# Patient Record
Sex: Female | Born: 1945 | Race: White | Hispanic: No | Marital: Single | Smoking: Never smoker
Health system: Southern US, Community
[De-identification: ages and names within clinical notes are randomized; demographics above are authoritative.]

## PROBLEM LIST (undated history)

## (undated) DIAGNOSIS — R42 Dizziness and giddiness: Secondary | ICD-10-CM

## (undated) DIAGNOSIS — I10 Essential (primary) hypertension: Secondary | ICD-10-CM

## (undated) HISTORY — PX: EYE SURGERY: SHX253

## (undated) HISTORY — PX: TUBAL LIGATION: SHX77

---

## 2004-04-26 ENCOUNTER — Inpatient Hospital Stay: Payer: Self-pay | Admitting: Anesthesiology

## 2005-04-20 ENCOUNTER — Other Ambulatory Visit: Payer: Self-pay

## 2005-04-20 ENCOUNTER — Emergency Department: Payer: Self-pay | Admitting: Emergency Medicine

## 2008-06-11 ENCOUNTER — Ambulatory Visit: Payer: Self-pay

## 2010-11-02 ENCOUNTER — Emergency Department: Payer: Self-pay | Admitting: *Deleted

## 2012-09-12 ENCOUNTER — Emergency Department: Payer: Self-pay | Admitting: Emergency Medicine

## 2012-09-12 LAB — TROPONIN I: Troponin-I: <0.02 ng/mL

## 2012-09-12 LAB — COMPREHENSIVE METABOLIC PANEL
Albumin: 4 g/dL (ref 3.4–5.0)
Alkaline Phosphatase: 159 U/L — ABNORMAL HIGH (ref 50–136)
Anion Gap: 6 — ABNORMAL LOW (ref 7–16)
BUN: 5 mg/dL — ABNORMAL LOW (ref 7–18)
Calcium, Total: 9.1 mg/dL (ref 8.5–10.1)
Co2: 26 mmol/L (ref 21–32)
Creatinine: 0.7 mg/dL (ref 0.60–1.30)
EGFR (African American): >60
EGFR (Non-African Amer.): >60
Glucose: 94 mg/dL (ref 65–99)
Potassium: 3.6 mmol/L (ref 3.5–5.1)
SGOT(AST): 26 U/L (ref 15–37)
Total Protein: 7.2 g/dL (ref 6.4–8.2)

## 2012-09-12 LAB — CBC WITH DIFFERENTIAL/PLATELET
Basophil #: 0.1 10*3/uL (ref 0.0–0.1)
Basophil %: 0.9 %
Eosinophil #: 0.3 10*3/uL (ref 0.0–0.7)
Eosinophil %: 3.7 %
HGB: 14.3 g/dL (ref 12.0–16.0)
MCH: 29.8 pg (ref 26.0–34.0)
Monocyte #: 0.5 x10 3/mm (ref 0.2–0.9)
Neutrophil #: 4.5 10*3/uL (ref 1.4–6.5)
Neutrophil %: 64.5 %

## 2018-06-08 ENCOUNTER — Encounter: Payer: Self-pay | Admitting: Emergency Medicine

## 2018-06-08 ENCOUNTER — Other Ambulatory Visit: Payer: Self-pay

## 2018-06-08 ENCOUNTER — Emergency Department: Payer: Medicare HMO

## 2018-06-08 ENCOUNTER — Emergency Department
Admission: EM | Admit: 2018-06-08 | Discharge: 2018-06-08 | Disposition: A | Discharge status: Home / Self Care | Payer: Medicare HMO | Attending: Emergency Medicine | Admitting: Emergency Medicine

## 2018-06-08 DIAGNOSIS — E049 Nontoxic goiter, unspecified: Secondary | ICD-10-CM | POA: Insufficient documentation

## 2018-06-08 DIAGNOSIS — Y9384 Activity, sleeping: Secondary | ICD-10-CM | POA: Diagnosis not present

## 2018-06-08 DIAGNOSIS — E860 Dehydration: Secondary | ICD-10-CM | POA: Diagnosis not present

## 2018-06-08 DIAGNOSIS — K449 Diaphragmatic hernia without obstruction or gangrene: Secondary | ICD-10-CM | POA: Diagnosis not present

## 2018-06-08 DIAGNOSIS — I10 Essential (primary) hypertension: Secondary | ICD-10-CM | POA: Diagnosis not present

## 2018-06-08 DIAGNOSIS — R000 Tachycardia, unspecified: Secondary | ICD-10-CM | POA: Insufficient documentation

## 2018-06-08 DIAGNOSIS — W19XXXA Unspecified fall, initial encounter: Secondary | ICD-10-CM

## 2018-06-08 DIAGNOSIS — W1830XA Fall on same level, unspecified, initial encounter: Secondary | ICD-10-CM | POA: Diagnosis not present

## 2018-06-08 DIAGNOSIS — Y92009 Unspecified place in unspecified non-institutional (private) residence as the place of occurrence of the external cause: Secondary | ICD-10-CM | POA: Insufficient documentation

## 2018-06-08 DIAGNOSIS — Y999 Unspecified external cause status: Secondary | ICD-10-CM | POA: Insufficient documentation

## 2018-06-08 DIAGNOSIS — R404 Transient alteration of awareness: Secondary | ICD-10-CM | POA: Insufficient documentation

## 2018-06-08 HISTORY — DX: Essential (primary) hypertension: I10

## 2018-06-08 LAB — URINALYSIS, COMPLETE (UACMP) WITH MICROSCOPIC
Bacteria, UA: NONE SEEN
Bilirubin Urine: NEGATIVE
Glucose, UA: NEGATIVE mg/dL
Hgb urine dipstick: NEGATIVE
Ketones, ur: 20 mg/dL — AB
Nitrite: NEGATIVE
Protein, ur: NEGATIVE mg/dL
Specific Gravity, Urine: 1.012 (ref 1.005–1.030)
pH: 5 (ref 5.0–8.0)

## 2018-06-08 LAB — BASIC METABOLIC PANEL
Anion gap: 14 (ref 5–15)
BUN: 14 mg/dL (ref 8–23)
CO2: 20 mmol/L — ABNORMAL LOW (ref 22–32)
Calcium: 9.5 mg/dL (ref 8.9–10.3)
Chloride: 108 mmol/L (ref 98–111)
Creatinine, Ser: 0.69 mg/dL (ref 0.44–1.00)
GFR calc Af Amer: >60 mL/min (ref >60)
GFR calc non Af Amer: >60 mL/min (ref >60)
Glucose, Bld: 85 mg/dL (ref 70–99)
Potassium: 4.2 mmol/L (ref 3.5–5.1)
Sodium: 142 mmol/L (ref 135–145)

## 2018-06-08 LAB — CBC WITH DIFFERENTIAL/PLATELET
Abs Immature Granulocytes: 0.05 10*3/uL (ref 0.00–0.07)
Basophils Absolute: 0.1 10*3/uL (ref 0.0–0.1)
Basophils Relative: 1 %
Eosinophils Absolute: 0.1 10*3/uL (ref 0.0–0.5)
Eosinophils Relative: 1 %
HCT: 50.2 % — ABNORMAL HIGH (ref 36.0–46.0)
Hemoglobin: 16.7 g/dL — ABNORMAL HIGH (ref 12.0–15.0)
Immature Granulocytes: 0 %
Lymphocytes Relative: 11 %
Lymphs Abs: 1.4 10*3/uL (ref 0.7–4.0)
MCH: 30.1 pg (ref 26.0–34.0)
MCHC: 33.3 g/dL (ref 30.0–36.0)
MCV: 90.6 fL (ref 80.0–100.0)
Monocytes Absolute: 1.1 10*3/uL — ABNORMAL HIGH (ref 0.1–1.0)
Monocytes Relative: 9 %
Neutro Abs: 9.8 10*3/uL — ABNORMAL HIGH (ref 1.7–7.7)
Neutrophils Relative %: 78 %
Platelets: 225 10*3/uL (ref 150–400)
RBC: 5.54 MIL/uL — ABNORMAL HIGH (ref 3.87–5.11)
RDW: 12.9 % (ref 11.5–15.5)
WBC: 12.6 10*3/uL — ABNORMAL HIGH (ref 4.0–10.5)
nRBC: 0 % (ref 0.0–0.2)

## 2018-06-08 LAB — TSH: TSH: 3.319 u[IU]/mL (ref 0.350–4.500)

## 2018-06-08 LAB — CK: Total CK: 73 U/L (ref 38–234)

## 2018-06-08 LAB — T4, FREE: Free T4: 0.83 ng/dL (ref 0.82–1.77)

## 2018-06-08 MED ORDER — IOHEXOL 350 MG/ML SOLN
75.0000 mL | Freq: Once | INTRAVENOUS | Status: AC | PRN
  Administered 2018-06-08: 75 mL via INTRAVENOUS

## 2018-06-08 MED ORDER — SODIUM CHLORIDE 0.9 % IV BOLUS
1000.0000 mL | Freq: Once | INTRAVENOUS | Status: AC
  Administered 2018-06-08: 1000 mL via INTRAVENOUS

## 2018-06-08 MED ORDER — DILTIAZEM HCL 25 MG/5ML IV SOLN
10.0000 mg | Freq: Once | INTRAVENOUS | Status: AC
  Administered 2018-06-08: 10 mg via INTRAVENOUS
  Filled 2018-06-08: qty 5

## 2018-06-08 NOTE — ED Notes (Signed)
Pt verbalized understanding of discharge instructions. NAD at this time. 

## 2018-06-08 NOTE — ED Triage Notes (Signed)
Pt to ED via [facility] from home for fall. Pt states that she is unsure when she fell. Per EMS, pt was found in the floor this morning by her daughter. Pt has hx/o HBP but does not take prescribed medication. Pt has not seen MD in 8 years. Per EMS pt also take OTC potassium. Pt states that she does not remember falling. States that last thing she remembers is watching something on TV and then waking up in the floor this morning. Pt is A & O x 4, in NAD. Pt c/o pain across her forehead.

## 2018-06-08 NOTE — ED Provider Notes (Signed)
[facility] Emergency Department Provider Note  ____________________________________________  Time seen: Approximately 3:10 PM  I have reviewed the triage vital signs and the nursing notes.   HISTORY  Chief Complaint Fall    HPI Yvonne Mejia is a 73 y.o. female with a history of hypertension who reports she was in her usual state of health, staying up late watching TV last night.  She does remember going to bed or falling asleep in her recliner, but this morning she awoke on the floor.  Her daughter found her and called EMS to have her evaluated.  The patient denies any complaints and states that she feels fine.  She says that she has been eating and drinking normally.  Most recently she drank a 2 L of Dr. Reino Kent as well as coffee and natural light beer..      Past Medical History:  Diagnosis Date  . Hypertension      There are no active problems to display for this patient.    Past Surgical History:  Procedure Laterality Date  . EYE SURGERY    . TUBAL LIGATION       Prior to Admission medications   Not on File  None   Allergies Patient has no known allergies.   No family history on file.  Social History Social History   Tobacco Use  . Smoking status: Never Smoker  . Smokeless tobacco: Never Used  Substance Use Topics  . Alcohol use: Yes  . Drug use: Not Currently    Review of Systems  Constitutional:   No fever or chills.  ENT:   No sore throat. No rhinorrhea. Cardiovascular:   No chest pain or syncope. Respiratory:   No dyspnea or cough. Gastrointestinal:   Negative for abdominal pain, vomiting and diarrhea.  Musculoskeletal:   Negative for focal pain or swelling All other systems reviewed and are negative except as documented above in ROS and HPI.  ____________________________________________   PHYSICAL EXAM:  VITAL SIGNS: ED Triage Vitals  Enc Vitals Group     BP 06/08/18 1006 (!) 160/111     Pulse Rate  06/08/18 1006 (!) 120     Resp 06/08/18 1006 16     Temp 06/08/18 1006 98.9 F (37.2 C)     Temp Source 06/08/18 1006 Oral     SpO2 06/08/18 1001 96 %     Weight 06/08/18 1002 200 lb (90.7 kg)     Height 06/08/18 1002  (1.499 m)     Head Circumference --      Peak Flow --      Pain Score 06/08/18 1002 6     Pain Loc --      Pain Edu? --      Excl. in GC? --     Vital signs reviewed, nursing assessments reviewed.   Constitutional:   Alert and oriented. Non-toxic appearance. Eyes:   Conjunctivae are normal. EOMI. PERRL. ENT      Head:   Normocephalic and atraumatic.      Nose:   No congestion/rhinnorhea.       Mouth/Throat:   Dry mucous membranes, no pharyngeal erythema. No peritonsillar mass.       Neck:   No meningismus. Full ROM. Hematological/Lymphatic/Immunilogical:   No cervical lymphadenopathy. Cardiovascular:   Tachycardia heart rate 120. Symmetric bilateral radial and DP pulses.  No murmurs. Cap refill less than 2 seconds. Respiratory:   Normal respiratory effort without tachypnea/retractions. Breath sounds are clear and equal  bilaterally. No wheezes/rales/rhonchi. Gastrointestinal:   Soft and nontender. Non distended. There is no CVA tenderness.  No rebound, rigidity, or guarding.  Musculoskeletal:   Normal range of motion in all extremities. No joint effusions.  No lower extremity tenderness.  No edema. Neurologic:   Normal speech and language.  No tremor Motor grossly intact. No acute focal neurologic deficits are appreciated.  Skin:    Skin is warm, dry and intact. No rash noted.  No petechiae, purpura, or bullae.  ____________________________________________    LABS (pertinent positives/negatives) (all labs ordered are listed, but only abnormal results are displayed) Labs Reviewed  BASIC METABOLIC PANEL - Abnormal; Notable for the following components:      Result Value   CO2 20 (*)    All other components within normal limits  CBC WITH  DIFFERENTIAL/PLATELET - Abnormal; Notable for the following components:   WBC 12.6 (*)    RBC 5.54 (*)    Hemoglobin 16.7 (*)    HCT 50.2 (*)    Neutro Abs 9.8 (*)    Monocytes Absolute 1.1 (*)    All other components within normal limits  URINALYSIS, COMPLETE (UACMP) WITH MICROSCOPIC - Abnormal; Notable for the following components:   Color, Urine YELLOW (*)    APPearance HAZY (*)    Ketones, ur 20 (*)    Leukocytes,Ua TRACE (*)    All other components within normal limits  URINE CULTURE  CK  T4, FREE  TSH   ____________________________________________   EKG  Interpreted by me Sinus tachycardia rate 119, left axis, normal intervals.  Normal QRS ST segments and T waves.  ____________________________________________    RADIOLOGY  Ct Head Wo Contrast  Result Date: 06/08/2018 CLINICAL DATA:  Altered level of consciousness EXAM: CT HEAD WITHOUT CONTRAST TECHNIQUE: Contiguous axial images were obtained from the base of the skull through the vertex without intravenous contrast. COMPARISON:  None. FINDINGS: Brain: No evidence of acute infarction, hemorrhage, extra-axial collection, ventriculomegaly, or mass effect. Generalized cerebral atrophy. Periventricular white matter low attenuation likely secondary to microangiopathy. Vascular: Cerebrovascular atherosclerotic calcifications are noted. Skull: Negative for fracture or focal lesion. Sinuses/Orbits: Visualized portions of the orbits are unremarkable. Visualized portions of the paranasal sinuses and mastoid air cells are unremarkable. Other: None. IMPRESSION: No acute intracranial pathology. Electronically Signed   By: Elige KoHetal  Patel   On: 06/08/2018 10:47    ____________________________________________   PROCEDURES Procedures  ____________________________________________  DIFFERENTIAL DIAGNOSIS   Dehydration, electrolyte abnormality, renal insufficiency, intracranial hemorrhage, cystitis, hyperthyroidism  CLINICAL IMPRESSION  / ASSESSMENT AND PLAN / ED COURSE  Medications ordered in the ED: Medications  sodium chloride 0.9 % bolus 1,000 mL (0 mLs Intravenous Stopped 06/08/18 1257)  sodium chloride 0.9 % bolus 1,000 mL (0 mLs Intravenous Stopped 06/08/18 1442)    Pertinent labs & imaging results that were available during my care of the patient were reviewed by me and considered in my medical decision making (see chart for details).  Azzie GlatterDebbra Aro was evaluated in Emergency Department on 06/08/2018 for the symptoms described in the history of present illness. She was evaluated in the context of the global COVID-19 pandemic, which necessitated consideration that the patient might be at risk for infection with the SARS-CoV-2 virus that causes COVID-19. Institutional protocols and algorithms that pertain to the evaluation of patients at risk for COVID-19 are in a state of rapid change based on information released by regulatory bodies including the CDC and federal and state organizations. These policies and algorithms were followed  during the patient's care in the ED.   Patient presents after being found on the floor last night.  Possibly related to drinking and dehydration.  Pronounced tachycardia, will check labs, CT scan, give IV fluids for hydration.  Clinical Course as of Jun 07 1508  Fri Jun 08, 2018  1111 Ct head negative for [facility]    [PS]    Clinical Course User Index [PS] Sharman Cheek, MD     ----------------------------------------- 3:13 PM on 06/08/2018 ----------------------------------------- Labs unremarkable.  Still tachycardic despite 2 L of IV fluids for hydration.  Patient still feels fine, tolerating oral intake.  I will check thyroid studies after which if not severely abnormal patient will be suitable for outpatient follow-up.  If normal would recommend follow-up with cardiology.  Doubt alcohol withdrawal, presentation not consistent with infection or  sepsis.   ____________________________________________   FINAL CLINICAL IMPRESSION(S) / ED DIAGNOSES    Final diagnoses:  Fall in home, initial encounter  Dehydration  Tachycardia     ED Discharge Orders    None      Portions of this note were generated with dragon dictation software. Dictation errors may occur despite best attempts at proofreading.   Sharman Cheek, MD 06/08/18 1515

## 2018-06-08 NOTE — ED Notes (Signed)
Pt placed on bed pan to try and get urine sample

## 2018-06-08 NOTE — Discharge Instructions (Addendum)
Please seek medical attention for any high fevers, chest pain, shortness of breath, change in behavior, persistent vomiting, bloody stool or any other new or concerning symptoms.  

## 2018-06-08 NOTE — ED Notes (Signed)
Pt given ginger ale for PO challenge. Pt refused food. Pt tolerated ginger ale.

## 2018-06-09 LAB — URINE CULTURE

## 2020-04-21 ENCOUNTER — Emergency Department: Payer: Medicare HMO

## 2020-04-21 ENCOUNTER — Emergency Department
Admission: EM | Admit: 2020-04-21 | Discharge: 2020-04-21 | Disposition: A | Discharge status: Home / Self Care | Payer: Medicare HMO | Attending: Emergency Medicine | Admitting: Emergency Medicine

## 2020-04-21 DIAGNOSIS — I10 Essential (primary) hypertension: Secondary | ICD-10-CM | POA: Insufficient documentation

## 2020-04-21 DIAGNOSIS — W010XXA Fall on same level from slipping, tripping and stumbling without subsequent striking against object, initial encounter: Secondary | ICD-10-CM | POA: Insufficient documentation

## 2020-04-21 DIAGNOSIS — S42291A Other displaced fracture of upper end of right humerus, initial encounter for closed fracture: Secondary | ICD-10-CM

## 2020-04-21 DIAGNOSIS — Y92009 Unspecified place in unspecified non-institutional (private) residence as the place of occurrence of the external cause: Secondary | ICD-10-CM | POA: Insufficient documentation

## 2020-04-21 DIAGNOSIS — M79601 Pain in right arm: Secondary | ICD-10-CM

## 2020-04-21 DIAGNOSIS — S4991XA Unspecified injury of right shoulder and upper arm, initial encounter: Secondary | ICD-10-CM | POA: Diagnosis present

## 2020-04-21 DIAGNOSIS — W19XXXA Unspecified fall, initial encounter: Secondary | ICD-10-CM

## 2020-04-21 HISTORY — DX: Dizziness and giddiness: R42

## 2020-04-21 MED ORDER — METHOCARBAMOL 500 MG PO TABS: 500.0000 mg | ORAL_TABLET | Freq: Three times a day (TID) | ORAL | 0 refills | Status: AC | PRN

## 2020-04-21 MED ORDER — ACETAMINOPHEN 500 MG PO TABS
1000.0000 mg | ORAL_TABLET | Freq: Once | ORAL | Status: AC
  Administered 2020-04-21: 1000 mg via ORAL
  Filled 2020-04-21: qty 2

## 2020-04-21 MED ORDER — ONDANSETRON 4 MG PO TBDP
4.0000 mg | ORAL_TABLET | Freq: Once | ORAL | Status: AC
  Administered 2020-04-21: 4 mg via ORAL
  Filled 2020-04-21: qty 1

## 2020-04-21 MED ORDER — ONDANSETRON 4 MG PO TBDP: 4.0000 mg | ORAL_TABLET | Freq: Three times a day (TID) | ORAL | 0 refills | Status: AC | PRN

## 2020-04-21 MED ORDER — NAPROXEN 500 MG PO TABS
500.0000 mg | ORAL_TABLET | Freq: Once | ORAL | Status: AC
  Administered 2020-04-21: 500 mg via ORAL
  Filled 2020-04-21: qty 1

## 2020-04-21 MED ORDER — METHOCARBAMOL 500 MG PO TABS
500.0000 mg | ORAL_TABLET | Freq: Once | ORAL | Status: AC
  Administered 2020-04-21: 500 mg via ORAL
  Filled 2020-04-21: qty 1

## 2020-04-21 NOTE — Discharge Instructions (Signed)
Use Tylenol for pain and fevers.  Up to 1000 mg per dose, up to 4 times per day.  Do not take more than 4000 mg of Tylenol/acetaminophen within 24 hours..  Use naproxen/Aleve for anti-inflammatory/pain relief.  Use this sparingly, perhaps only once per day.  You are being discharged with 2 prescriptions, Robaxin muscle relaxer to use for severe pain Zofran to use for any further nausea  Please follow-up with a local orthopedic surgeon in their clinic.  Keep your right arm in a sling to help support and prevent pain.  You should not require a surgery to fix this, and it should heal on its own okay with time.  If you develop the inability to use/feel your right arm, fevers, please return to the ED.

## 2020-04-21 NOTE — ED Provider Notes (Signed)
[facility] Emergency Department Provider Note ____________________________________________   Event Date/Time   First MD Initiated Contact with Patient 04/21/20 1856     (approximate)  I have reviewed the triage vital signs and the nursing notes.  HISTORY  Chief Complaint Fall and Arm Injury   HPI Yvonne Mejia is a 75 y.o. femalewho presents to the ED for evaluation of fall and arm injury.   Chart review indicates pt takes ASA 81 as only thinner.   Pt reports accidentally tripping over her cat this evening, causing her to fall forward and then catch herself with her right outstretched hand. Denies head trauma, syncope or additional pain/injury beyond her right arm. Reports severe proximal right arm pain with movement. She was unable to get up afterwards, so calls 911 for assistance.  She lives at home along and ambulates with a cane at baseline. No recent illnesses.    Past Medical History:  Diagnosis Date  . Equilibrium disorder   . Hypertension     There are no problems to display for this patient.   Past Surgical History:  Procedure Laterality Date  . EYE SURGERY    . TUBAL LIGATION      Prior to Admission medications   Medication Sig Start Date End Date Taking? Authorizing Provider  methocarbamol (ROBAXIN) 500 MG tablet Take 1 tablet (500 mg total) by mouth every 8 (eight) hours as needed for muscle spasms (breakthrough pain). 04/21/20  Yes Delton Prairie, MD  ondansetron (ZOFRAN ODT) 4 MG disintegrating tablet Take 1 tablet (4 mg total) by mouth every 8 (eight) hours as needed for nausea or vomiting. 04/21/20  Yes Delton Prairie, MD  aspirin 325 MG tablet Take 650 mg by mouth 4 (four) times daily.    [provider]  Aspirin-Salicylamide-Caffeine (BC HEADACHE POWDER PO) Take by mouth daily as needed.    [provider]  Garlic 100 MG TABS Take by mouth 4 (four) times daily.    [provider]  Potassium 99 MG TABS  Take 1 tablet by mouth daily.    [provider]    Allergies Brimonidine tartrate-timolol  No family history on file.  Social History Social History   Tobacco Use  . Smoking status: Never Smoker  . Smokeless tobacco: Never Used  Substance Use Topics  . Alcohol use: Yes  . Drug use: Not Currently    Review of Systems  Constitutional: No fever/chills Eyes: No visual changes. ENT: No sore throat. Cardiovascular: Denies chest pain. Respiratory: Denies shortness of breath. Gastrointestinal: No abdominal pain.  No nausea, no vomiting.  No diarrhea.  No constipation. Genitourinary: Negative for dysuria. Musculoskeletal: Negative for back pain. Positive for right arm pain.  Skin: Negative for rash. Neurological: Negative for headaches, focal weakness or numbness.  ____________________________________________   PHYSICAL EXAM:  VITAL SIGNS: Vitals:   04/21/20 1857  BP: (!) 172/114  Pulse: 96  Resp: 20  Temp: 98.2 F (36.8 C)  SpO2: 96%      Constitutional: Alert and oriented. Well appearing. Cries out in pain with manipulation of RUE. R arm in homemade pre-hospital sling.  Eyes: Conjunctivae are normal. PERRL. EOMI. Head: Atraumatic. Nose: No congestion/rhinnorhea. Mouth/Throat: Mucous membranes are moist.  Oropharynx non-erythematous. Neck: No stridor. No cervical spine tenderness to palpation. Cardiovascular: Normal rate, regular rhythm. Grossly normal heart sounds.  Good peripheral circulation. Respiratory: Normal respiratory effort.  No retractions. Lungs CTAB. Gastrointestinal: Soft , nondistended, nontender to palpation. No CVA tenderness. Musculoskeletal: No lower  extremity tenderness nor edema.  No joint effusions.  Exquisite TTP over her humerus. No evidence of open injury.  RUE is distally neurovascularly intact. No TTP otherwise to RUE, nothing distal to elbow.  Palpation of all 4 extremities otherwise without evidence of deformity, TTP, or  trauma.  Neurologic:  Normal speech and language. No gross focal neurologic deficits are appreciated. No gait instability noted. Skin:  Skin is warm, dry and intact. No rash noted. Psychiatric: Mood and affect are normal. Speech and behavior are normal.  ____________________________________________  RADIOLOGY  ED MD interpretation: Plain film of the right humerus reviewed by me with right-sided proximal humerus fracture  Official radiology report(s): DG Humerus Right  Result Date: 04/21/2020 CLINICAL DATA:  Mechanical fall EXAM: RIGHT HUMERUS - 2+ VIEW COMPARISON:  None. FINDINGS: Acute fracture involving right humeral neck with about 1/4 shaft diameter anterior displacement of distal fracture fragment and mild posterior angulation. No humeral head dislocation IMPRESSION: Acute mildly displaced and angulated right humeral neck fracture. Electronically Signed   By: Jasmine Pang M.D.   On: 04/21/2020 19:36    ____________________________________________   PROCEDURES and INTERVENTIONS  Procedure(s) performed (including Critical Care):  Procedures  Medications  acetaminophen (TYLENOL) tablet 1,000 mg (1,000 mg Oral Given 04/21/20 1941)  ondansetron (ZOFRAN-ODT) disintegrating tablet 4 mg (4 mg Oral Given 04/21/20 1955)  naproxen (NAPROSYN) tablet 500 mg (500 mg Oral Given 04/21/20 1955)  methocarbamol (ROBAXIN) tablet 500 mg (500 mg Oral Given 04/21/20 1955)    ____________________________________________   MDM / ED COURSE   Pleasant 75 year old woman presents after mechanical fall with right upper extremity pain, with closed proximal humerus fracture, amenable to sling and outpatient management.  Mild hypertension, likely due to pain and otherwise normal vitals on room air.  Exam with isolated tenderness to palpation to her right proximal humerus.  No signs of shoulder dislocation or open injury.  No further signs of trauma or acute injury.  No neurologic or vascular deficits.  Plain  film confirms proximal humerus fracture on the right but is closed without shoulder dislocation.  Provided multimodal nonnarcotic pain control, sling and recommendations to follow-up with orthopedics as an outpatient.  Return precautions for the ED were discussed.      ____________________________________________   FINAL CLINICAL IMPRESSION(S) / ED DIAGNOSES  Final diagnoses:  Fall, initial encounter  Other closed displaced fracture of proximal end of right humerus, initial encounter  Right arm pain     ED Discharge Orders         Ordered    ondansetron (ZOFRAN ODT) 4 MG disintegrating tablet  Every 8 hours PRN        04/21/20 1958    methocarbamol (ROBAXIN) 500 MG tablet  Every 8 hours PRN        04/21/20 1958           Doil Kamara   Note:  This document was prepared using Conservation officer, historic buildings and may include unintentional dictation errors.   Delton Prairie, MD 04/21/20 2002

## 2020-04-21 NOTE — ED Notes (Signed)
MD at bedside to update patient and daughter on plan of care.

## 2020-04-21 NOTE — ED Triage Notes (Signed)
Fell at home.  Tripped. Caught herself with her right arm.  Pain right upper arm.  She cries out at times with pain.

## 2020-06-28 IMAGING — CT CT ANGIOGRAPHY CHEST
2 of 6 series · 18 of 46 positions shown · IV contrast (APPLIED)
Comparison: None.

CLINICAL DATA: Tachycardia, possible syncope

EXAM:
CT ANGIOGRAPHY CHEST WITH CONTRAST
TECHNIQUE: Multidetector CT imaging of the chest was performed using the
standard protocol during bolus administration of intravenous
contrast. Multiplanar CT image reconstructions and MIPs were
obtained to evaluate the vascular anatomy.
CONTRAST:  75mL OMNIPAQUE IOHEXOL 350 MG/ML SOLN

[Series 6: thins · axial · 0.65mm/px · z∈[-293,-62]mm · 16 of 255 slices shown]
[im 12/255  lung]
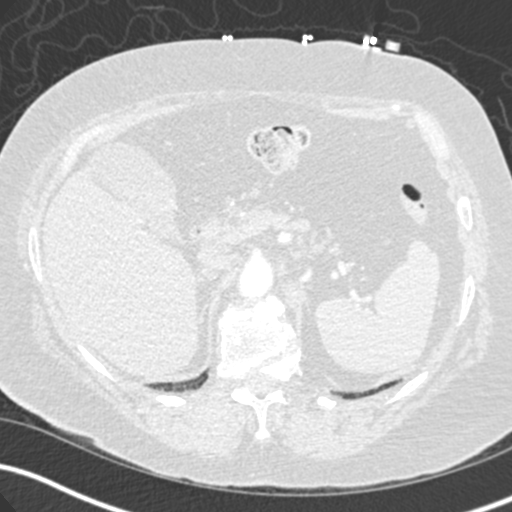
[im 34/255  soft-tissue]
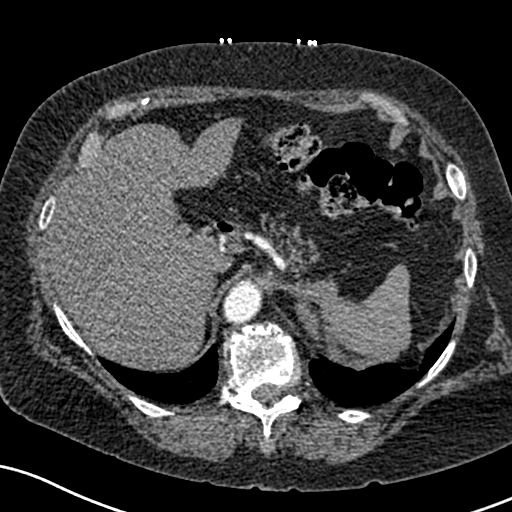
[im 45/255  lung]
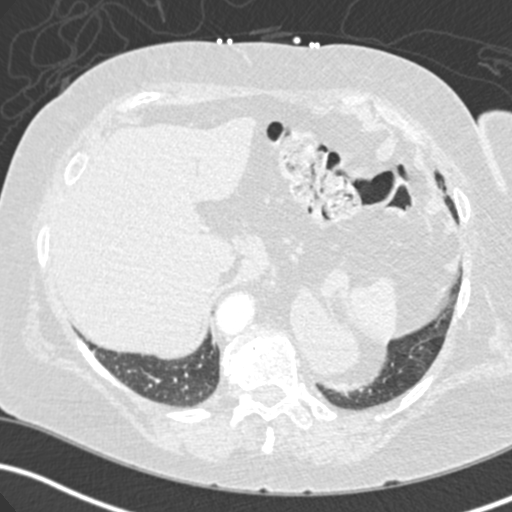
[im 56/255  soft-tissue]
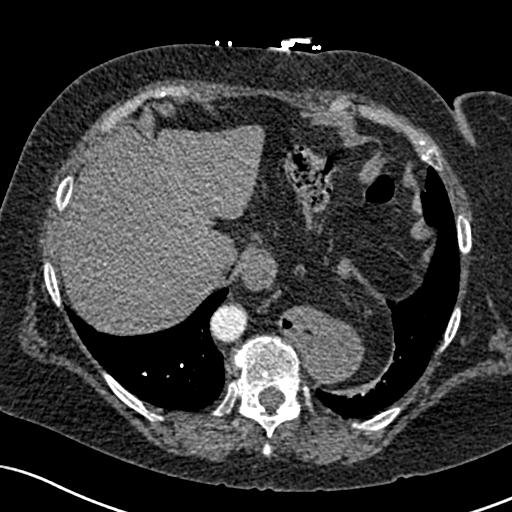
[im 78/255  lung]
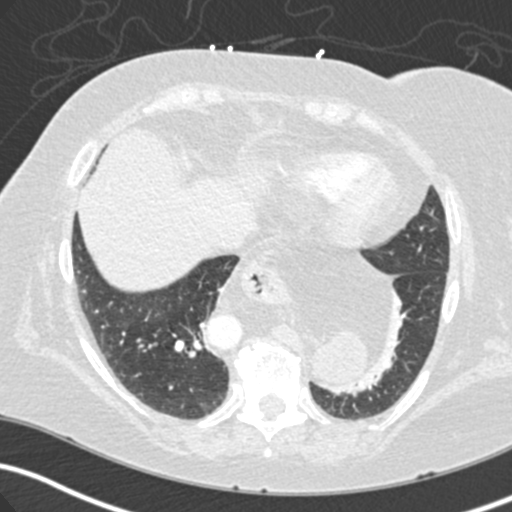
[im 89/255  soft-tissue]
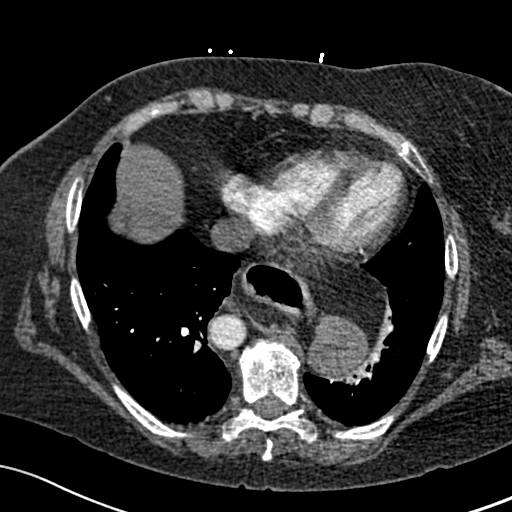
[im 100/255  lung]
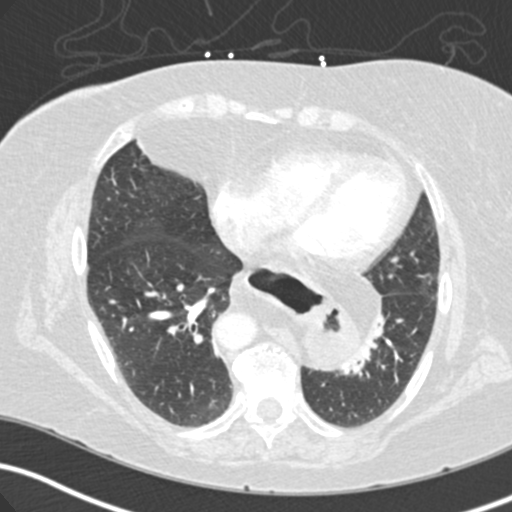
[im 122/255  soft-tissue]
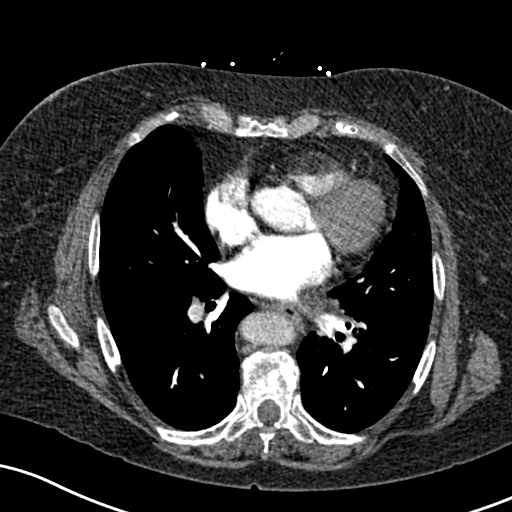
[im 133/255  lung]
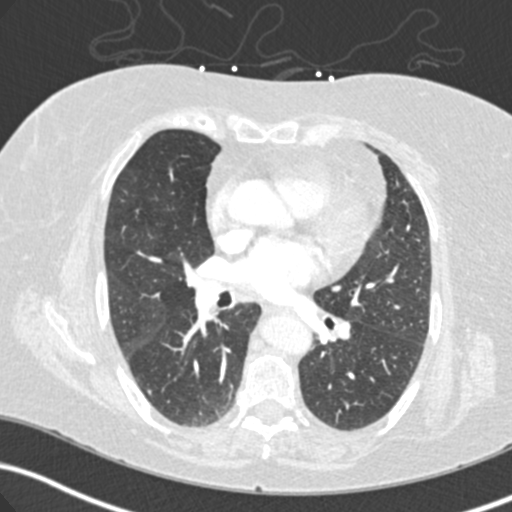
[im 155/255  soft-tissue]
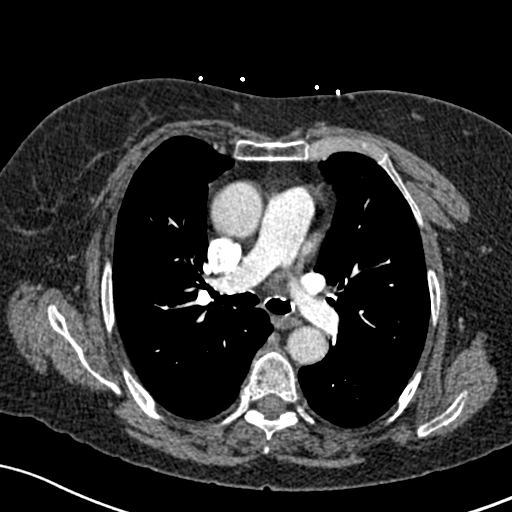
[im 166/255  lung]
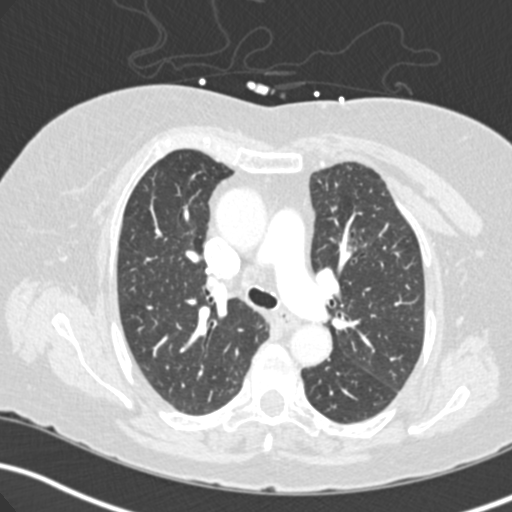
[im 177/255  soft-tissue]
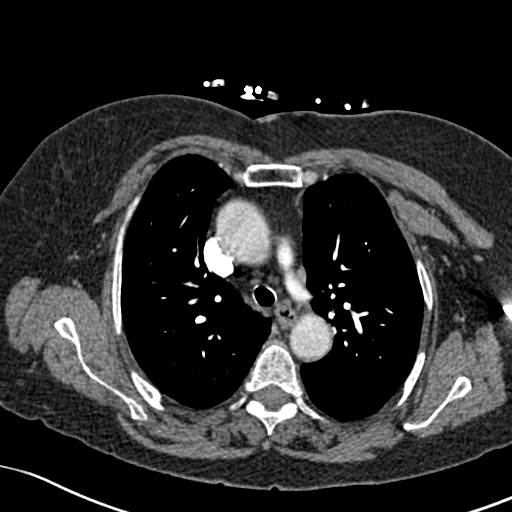
[im 199/255  lung]
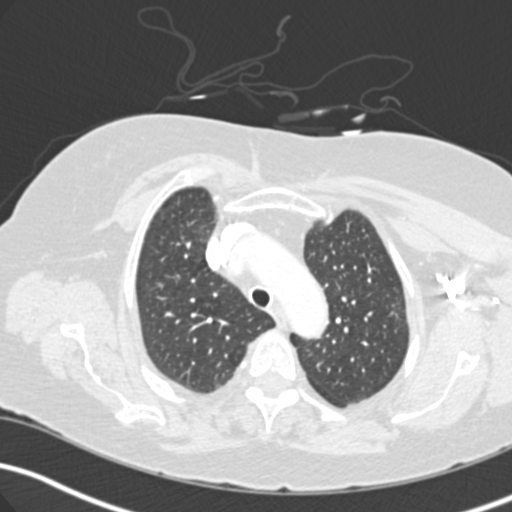
[im 210/255  soft-tissue]
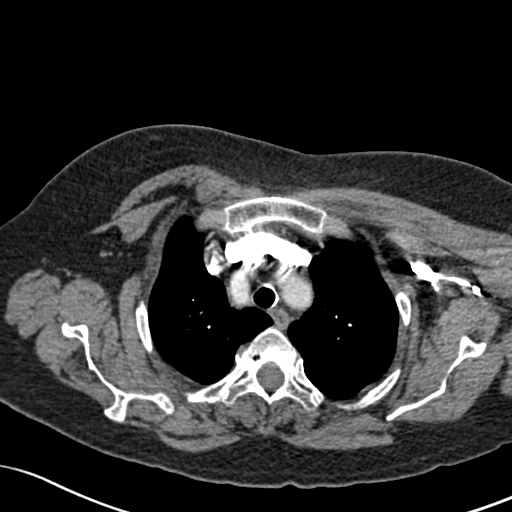
[im 221/255  lung]
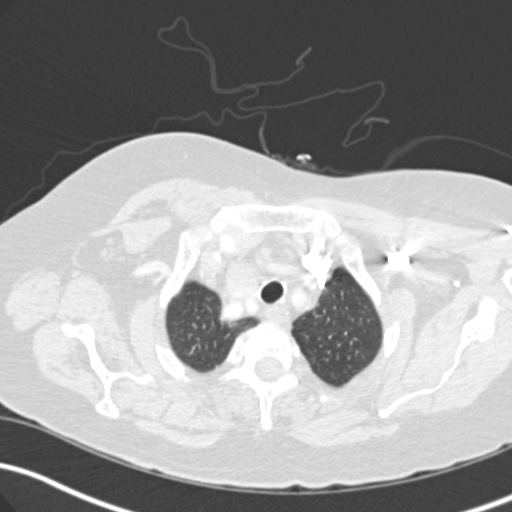
[im 243/255  soft-tissue]
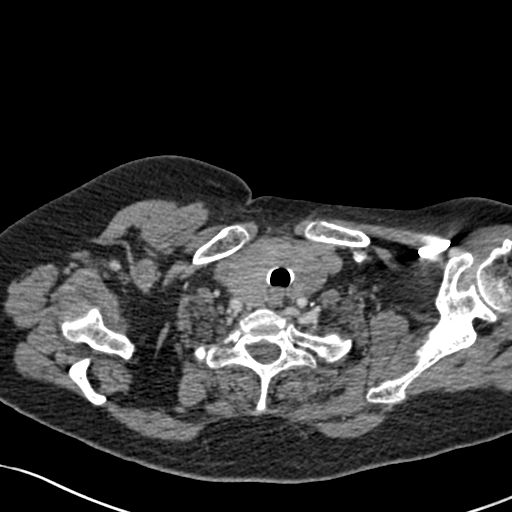

[Series 8: coronal mpr · coronal · 0.50mm/px · 2 of 91 slices shown]
[im 31/91  soft-tissue]
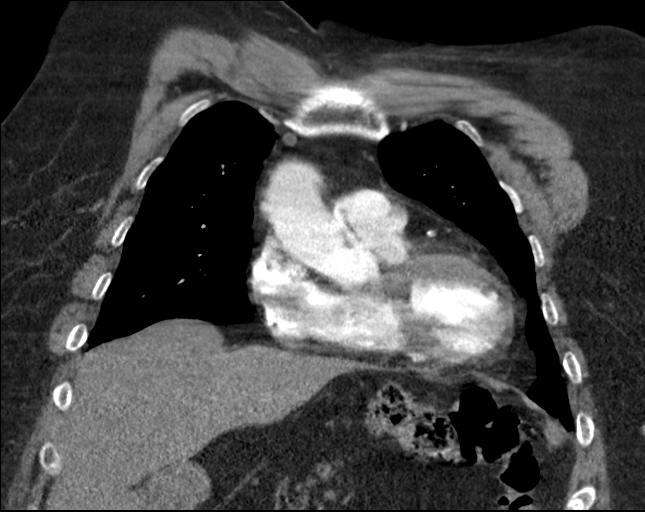
[im 61/91  soft-tissue]
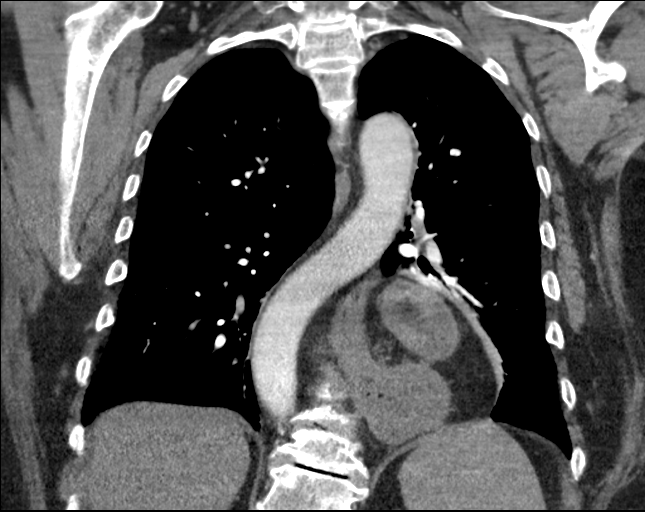

[18 of 46 positions shown; findings below may reference images not displayed]

FINDINGS: Cardiovascular: Satisfactory opacification of the pulmonary arteries
to the segmental level. No evidence of pulmonary embolism. Normal
heart size. No pericardial effusion.

Mediastinum/Nodes: No enlarged mediastinal, hilar, or axillary lymph
nodes. Large hiatal hernia. Enlarged thyroid with a probable nodule
of the inferior pole of the right lobe measuring at least 2.4 cm
(series 8, image 51). Trachea, and esophagus demonstrate no
significant findings.

Lungs/Pleura: Lungs are clear. No pleural effusion or pneumothorax.

Upper Abdomen: No acute abnormality.

Musculoskeletal: No chest wall abnormality. No acute or significant
osseous findings.

Review of the MIP images confirms the above findings.
IMPRESSION: 1.  Negative examination for pulmonary embolism.

2.  Large hiatal hernia.

3. Enlarged thyroid with a probable nodule of the inferior pole of
the right lobe measuring at least 2.4 cm (series 8, image 51).
Consider dedicated thyroid ultrasound to further evaluate on a
nonemergent basis.
